# Patient Record
Sex: Male | Born: 1935 | Race: White | Hispanic: No | Marital: Single | Smoking: Never smoker
Health system: Southern US, Community
[De-identification: ages and names within clinical notes are randomized; demographics above are authoritative.]

## PROBLEM LIST (undated history)

## (undated) DIAGNOSIS — Z950 Presence of cardiac pacemaker: Secondary | ICD-10-CM

## (undated) DIAGNOSIS — I2510 Atherosclerotic heart disease of native coronary artery without angina pectoris: Secondary | ICD-10-CM

## (undated) DIAGNOSIS — N400 Enlarged prostate without lower urinary tract symptoms: Secondary | ICD-10-CM

## (undated) DIAGNOSIS — K219 Gastro-esophageal reflux disease without esophagitis: Secondary | ICD-10-CM

## (undated) HISTORY — PX: CORONARY ARTERY BYPASS GRAFT: SHX141

## (undated) HISTORY — PX: PACEMAKER INSERTION: SHX728

## (undated) HISTORY — PX: REPLACEMENT TOTAL KNEE: SUR1224

---

## 1998-11-01 ENCOUNTER — Ambulatory Visit (HOSPITAL_COMMUNITY): Admission: RE | Admit: 1998-11-01 | Discharge: 1998-11-01 | Payer: Self-pay | Admitting: Cardiology

## 2007-11-22 ENCOUNTER — Emergency Department (HOSPITAL_BASED_OUTPATIENT_CLINIC_OR_DEPARTMENT_OTHER): Admission: EM | Admit: 2007-11-22 | Discharge: 2007-11-22 | Payer: Self-pay | Admitting: Emergency Medicine

## 2014-10-16 ENCOUNTER — Emergency Department (HOSPITAL_BASED_OUTPATIENT_CLINIC_OR_DEPARTMENT_OTHER): Payer: Medicare PPO

## 2014-10-16 ENCOUNTER — Encounter (HOSPITAL_BASED_OUTPATIENT_CLINIC_OR_DEPARTMENT_OTHER): Payer: Self-pay

## 2014-10-16 ENCOUNTER — Emergency Department (HOSPITAL_BASED_OUTPATIENT_CLINIC_OR_DEPARTMENT_OTHER)
Admission: EM | Admit: 2014-10-16 | Discharge: 2014-10-16 | Disposition: A | Discharge status: Home / Self Care | Payer: Medicare PPO | Attending: Emergency Medicine | Admitting: Emergency Medicine

## 2014-10-16 DIAGNOSIS — H578 Other specified disorders of eye and adnexa: Secondary | ICD-10-CM | POA: Diagnosis not present

## 2014-10-16 DIAGNOSIS — I2510 Atherosclerotic heart disease of native coronary artery without angina pectoris: Secondary | ICD-10-CM | POA: Diagnosis not present

## 2014-10-16 DIAGNOSIS — J3489 Other specified disorders of nose and nasal sinuses: Secondary | ICD-10-CM | POA: Diagnosis not present

## 2014-10-16 DIAGNOSIS — M542 Cervicalgia: Secondary | ICD-10-CM | POA: Insufficient documentation

## 2014-10-16 DIAGNOSIS — R350 Frequency of micturition: Secondary | ICD-10-CM | POA: Diagnosis present

## 2014-10-16 DIAGNOSIS — Z950 Presence of cardiac pacemaker: Secondary | ICD-10-CM | POA: Diagnosis not present

## 2014-10-16 DIAGNOSIS — N390 Urinary tract infection, site not specified: Secondary | ICD-10-CM | POA: Insufficient documentation

## 2014-10-16 DIAGNOSIS — R05 Cough: Secondary | ICD-10-CM | POA: Insufficient documentation

## 2014-10-16 DIAGNOSIS — Z7982 Long term (current) use of aspirin: Secondary | ICD-10-CM | POA: Diagnosis not present

## 2014-10-16 DIAGNOSIS — K219 Gastro-esophageal reflux disease without esophagitis: Secondary | ICD-10-CM | POA: Insufficient documentation

## 2014-10-16 DIAGNOSIS — Z79899 Other long term (current) drug therapy: Secondary | ICD-10-CM | POA: Insufficient documentation

## 2014-10-16 DIAGNOSIS — Z87438 Personal history of other diseases of male genital organs: Secondary | ICD-10-CM | POA: Diagnosis not present

## 2014-10-16 HISTORY — DX: Gastro-esophageal reflux disease without esophagitis: K21.9

## 2014-10-16 HISTORY — DX: Presence of cardiac pacemaker: Z95.0

## 2014-10-16 HISTORY — DX: Benign prostatic hyperplasia without lower urinary tract symptoms: N40.0

## 2014-10-16 HISTORY — DX: Atherosclerotic heart disease of native coronary artery without angina pectoris: I25.10

## 2014-10-16 LAB — CBC WITH DIFFERENTIAL/PLATELET
BASOS ABS: 0 10*3/uL (ref 0.0–0.1)
BASOS PCT: 0 % (ref 0–1)
EOS ABS: 0 10*3/uL (ref 0.0–0.7)
EOS PCT: 0 % (ref 0–5)
HCT: 33.5 % — ABNORMAL LOW (ref 39.0–52.0)
HEMOGLOBIN: 10.8 g/dL — AB (ref 13.0–17.0)
Lymphocytes Relative: 10 % — ABNORMAL LOW (ref 12–46)
Lymphs Abs: 1.5 10*3/uL (ref 0.7–4.0)
MCH: 28.3 pg (ref 26.0–34.0)
MCHC: 32.2 g/dL (ref 30.0–36.0)
MCV: 87.7 fL (ref 78.0–100.0)
MONO ABS: 1.8 10*3/uL — AB (ref 0.1–1.0)
MONOS PCT: 12 % (ref 3–12)
NEUTROS ABS: 11.5 10*3/uL — AB (ref 1.7–7.7)
NEUTROS PCT: 78 % — AB (ref 43–77)
PLATELETS: 324 10*3/uL (ref 150–400)
RBC: 3.82 MIL/uL — ABNORMAL LOW (ref 4.22–5.81)
RDW: 14 % (ref 11.5–15.5)
WBC: 14.8 10*3/uL — AB (ref 4.0–10.5)

## 2014-10-16 LAB — URINALYSIS, ROUTINE W REFLEX MICROSCOPIC
BILIRUBIN URINE: NEGATIVE
Glucose, UA: NEGATIVE mg/dL
Hgb urine dipstick: NEGATIVE
Ketones, ur: NEGATIVE mg/dL
NITRITE: NEGATIVE
Protein, ur: NEGATIVE mg/dL
SPECIFIC GRAVITY, URINE: 1.017 (ref 1.005–1.030)
Urobilinogen, UA: 1 mg/dL (ref 0.0–1.0)
pH: 6 (ref 5.0–8.0)

## 2014-10-16 LAB — BASIC METABOLIC PANEL
ANION GAP: 9 (ref 5–15)
BUN: 21 mg/dL — ABNORMAL HIGH (ref 6–20)
CHLORIDE: 103 mmol/L (ref 101–111)
CO2: 25 mmol/L (ref 22–32)
Calcium: 8.8 mg/dL — ABNORMAL LOW (ref 8.9–10.3)
Creatinine, Ser: 1.33 mg/dL — ABNORMAL HIGH (ref 0.61–1.24)
GFR, EST AFRICAN AMERICAN: 55 mL/min — AB (ref >60)
GFR, EST NON AFRICAN AMERICAN: 47 mL/min — AB (ref >60)
Glucose, Bld: 119 mg/dL — ABNORMAL HIGH (ref 65–99)
Potassium: 3.7 mmol/L (ref 3.5–5.1)
Sodium: 137 mmol/L (ref 135–145)

## 2014-10-16 LAB — URINE MICROSCOPIC-ADD ON

## 2014-10-16 MED ORDER — CEPHALEXIN 500 MG PO CAPS: 500.0000 mg | ORAL_CAPSULE | Freq: Two times a day (BID) | ORAL | Status: AC

## 2014-10-16 MED ORDER — CEFTRIAXONE SODIUM 1 G IJ SOLR
1.0000 g | Freq: Once | INTRAMUSCULAR | Status: AC
  Administered 2014-10-16: 1 g via INTRAVENOUS

## 2014-10-16 MED ORDER — ACETAMINOPHEN 325 MG PO TABS
650.0000 mg | ORAL_TABLET | Freq: Once | ORAL | Status: AC
  Administered 2014-10-16: 650 mg via ORAL
  Filled 2014-10-16: qty 2

## 2014-10-16 MED ORDER — CEFTRIAXONE SODIUM 1 G IJ SOLR
INTRAMUSCULAR | Status: AC
  Filled 2014-10-16: qty 10

## 2014-10-16 NOTE — ED Provider Notes (Signed)
CSN: [id number]     Arrival date & time 10/16/14  1509 History  This chart was scribed for Blake Divine, MD by Placido Sou, ED scribe. This patient was seen in room MH10/MH10 and the patient's care was started at 3:35 PM.   Chief Complaint  Patient presents with  . Urinary Frequency   Patient is a 81 y.o. male presenting with frequency. The history is provided by the patient. No language interpreter was used.  Urinary Frequency This is a new problem. The current episode started more than 2 days ago. The problem occurs constantly. The problem has been gradually worsening. Pertinent negatives include no shortness of breath.    HPI Comments: Jeremy Potts is a 81 y.o. male, with a hx of benign prostatic hyperplasia, who presents to the Emergency Department complaining of increased urinary frequency with onset a few days ago and worsening symptoms beginning last night. Pt notes that beginning last night he urinated about 30x and is concerned that he has a kidney infection. Pt notes associated, diffuse, moderate, body aches that worsen with movement and further notes a mild cough, rhinorrhea and eye irritation. He notes taking metoprolol 1x per night for more than 6 months s/p have a pacemaker put in place in 11/2013. Pt also notes taking 325 mg aspirin and another blood pressure medication daily. He denies dysuria, SOB and chills.  Past Medical History  Diagnosis Date  . Pacemaker   . GERD (gastroesophageal reflux disease)   . BPH (benign prostatic hyperplasia)   . Coronary artery disease    No past surgical history on file. No family history on file. Social History  Substance Use Topics  . Smoking status: None  . Smokeless tobacco: None  . Alcohol Use: None    Review of Systems  Constitutional: Positive for fever. Negative for chills.  HENT: Positive for rhinorrhea.   Eyes: Positive for itching.  Respiratory: Positive for cough. Negative for shortness of breath.   Genitourinary:  Positive for frequency.  Musculoskeletal: Positive for myalgias, back pain, arthralgias and neck pain.  Skin: Negative for wound.  All other systems reviewed and are negative.  Allergies  Review of patient's allergies indicates not on file.  Home Medications   Prior to Admission medications   Medication Sig Start Date End Date Taking? Authorizing Provider  aspirin 325 MG tablet Take 325 mg by mouth daily.   Yes Historical Provider, MD  Cholecalciferol (VITAMIN D3) 2000 UNITS TABS Take by mouth.   Yes Historical Provider, MD  co-enzyme Q-10 30 MG capsule Take 100 mg by mouth daily.   Yes Historical Provider, MD  fenofibrate (TRICOR) 145 MG tablet Take 145 mg by mouth daily.   Yes Historical Provider, MD  lansoprazole (PREVACID) 30 MG capsule Take 30 mg by mouth daily.   Yes Historical Provider, MD  lisinopril (PRINIVIL,ZESTRIL) 10 MG tablet Take 10 mg by mouth daily.   Yes Historical Provider, MD  metoprolol tartrate (LOPRESSOR) 25 MG tablet Take 25 mg by mouth at bedtime.   Yes Historical Provider, MD  Misc Natural Products (OSTEO BI-FLEX ADV TRIPLE ST) TABS Take by mouth 2 (two) times daily.   Yes Historical Provider, MD  tamsulosin (FLOMAX) 0.4 MG CAPS capsule Take 0.4 mg by mouth.   Yes Historical Provider, MD  vitamin C (ASCORBIC ACID) 500 MG tablet Take 500 mg by mouth daily.   Yes Historical Provider, MD   BP 147/73 mmHg  Pulse 77  Temp(Src) 101.2 F (38.4 C) (Oral)  Resp  18  Ht  (1.753 m)  Wt 193 lb (87.544 kg)  BMI 28.49 kg/m2  SpO2 95% Physical Exam  Constitutional: He is oriented to person, place, and time. He appears well-developed and well-nourished. No distress.  HENT:  Head: Normocephalic and atraumatic.  Mouth/Throat: Oropharynx is clear and moist.  Eyes: Conjunctivae are normal. Pupils are equal, round, and reactive to light. No scleral icterus.  Neck: Neck supple.  Cardiovascular: Normal rate, regular rhythm, normal heart sounds and intact distal pulses.    No murmur heard. Pulmonary/Chest: Effort normal and breath sounds normal. No stridor. No respiratory distress. He has no wheezes. He has no rales.  Abdominal: Soft. He exhibits no distension. There is no tenderness. There is no rigidity, no rebound, no guarding and no CVA tenderness.  Musculoskeletal: Normal range of motion. He exhibits no edema.  Neurological: He is alert and oriented to person, place, and time.  Skin: Skin is warm and dry. No rash noted.  Psychiatric: He has a normal mood and affect. His behavior is normal.  Nursing note and vitals reviewed.   ED Course  Procedures  DIAGNOSTIC STUDIES: Oxygen Saturation is 95% on RA, adequate by my interpretation.    COORDINATION OF CARE: 3:46 PM Discussed treatment plan with pt at bedside and pt agreed to plan.  Labs Review Labs Reviewed  URINALYSIS, ROUTINE W REFLEX MICROSCOPIC (NOT AT [facility]) - Abnormal; Notable for the following:    APPearance CLOUDY (*)    Leukocytes, UA MODERATE (*)    All other components within normal limits  CBC WITH DIFFERENTIAL/PLATELET - Abnormal; Notable for the following:    WBC 14.8 (*)    RBC 3.82 (*)    Hemoglobin 10.8 (*)    HCT 33.5 (*)    Neutrophils Relative % 78 (*)    Neutro Abs 11.5 (*)    Lymphocytes Relative 10 (*)    Monocytes Absolute 1.8 (*)    All other components within normal limits  BASIC METABOLIC PANEL - Abnormal; Notable for the following:    Glucose, Bld 119 (*)    BUN 21 (*)    Creatinine, Ser 1.33 (*)    Calcium 8.8 (*)    GFR calc non Af Amer 47 (*)    GFR calc Af Amer 55 (*)    All other components within normal limits  URINE MICROSCOPIC-ADD ON - Abnormal; Notable for the following:    Bacteria, UA MANY (*)    All other components within normal limits  URINE CULTURE    Imaging Review Dg Chest 2 View  10/16/2014   CLINICAL DATA:  81 year old male with fever and body aches for the past 2 days.  EXAM: CHEST  2 VIEW  COMPARISON:  No priors.  FINDINGS: Lung  volumes are normal. No consolidative airspace disease. No pleural effusions. No pneumothorax. No pulmonary nodule or mass noted. Pulmonary vasculature and the cardiomediastinal silhouette are within normal limits. Atherosclerosis in the thoracic aorta. Status post median sternotomy for CABG. Left-sided pacemaker device in position with lead tips projecting over the expected location of the right atrium and right ventricular apex.  IMPRESSION: 1. No radiographic evidence of acute cardiopulmonary disease. 2. Atherosclerosis.   Electronically Signed   By: Trudie Reed M.D.   On: 10/16/2014 16:33   I, Blake Divine, MD, personally reviewed and evaluated these images and lab results as part of my medical decision-making.   EKG Interpretation None      MDM   Final diagnoses:  Urinary tract infection without hematuria, site unspecified    81 year old male presenting with urinary frequency. He also complains of diffuse body aches. Was found to be febrile on arrival. However, he is not septic. In fact, he is very well-appearing. He has had a mild cough in addition to his urinary symptoms, so will obtain chest x-ray. Screening labs due to age and fever..  Presentation consistent with acute UTI. During his ED course he has remained well-appearing and hemodynamically stable. He received a dose of Rocephin IV. Given as well as parents and stability, I think he is appropriate for outpatient treatment. He is in agreement with this plan. He knows to come back if symptoms worsen. He will follow-up with his primary doctor early next week.  I personally performed the services described in this documentation, which was scribed in my presence. The recorded information has been reviewed and is accurate.     Blake Divine, MD 10/16/14 2010

## 2014-10-16 NOTE — ED Notes (Signed)
Pt reports increased frequency and urgency x 1 day- back pain also

## 2014-10-16 NOTE — Discharge Instructions (Signed)

## 2014-10-19 LAB — URINE CULTURE: Culture: >=100000

## 2014-10-20 ENCOUNTER — Telehealth (HOSPITAL_COMMUNITY): Payer: Self-pay

## 2014-10-20 NOTE — Telephone Encounter (Signed)
Post ED Visit - Positive Culture Follow-up  Culture report reviewed by antimicrobial stewardship pharmacist:  Wes Dulaney, Pharm.D., BCPS  Celedonio Miyamoto, Pharm.D., BCPS  Georgina Pillion, Pharm.D., BCPS  [place], [address].D., BCPS, AAHIVP  Estella Husk, Pharm.D., BCPS, AAHIVP  [place], [address].D., BCPS X  T. [employer]. D.   Positive Urine culture, >/+ 100,000 colonies -> E Coli Treated with Cephalexin, organism sensitive to the same and no further patient follow-up is required at this time.  Arvid Right 10/20/2014, 4:59 PM

## 2015-04-08 ENCOUNTER — Encounter (HOSPITAL_BASED_OUTPATIENT_CLINIC_OR_DEPARTMENT_OTHER): Payer: Self-pay | Admitting: *Deleted

## 2015-04-08 ENCOUNTER — Emergency Department (HOSPITAL_BASED_OUTPATIENT_CLINIC_OR_DEPARTMENT_OTHER)
Admission: EM | Admit: 2015-04-08 | Discharge: 2015-04-08 | Disposition: A | Discharge status: Home / Self Care | Payer: Medicare PPO | Attending: Emergency Medicine | Admitting: Emergency Medicine

## 2015-04-08 ENCOUNTER — Emergency Department (HOSPITAL_BASED_OUTPATIENT_CLINIC_OR_DEPARTMENT_OTHER): Payer: Medicare PPO

## 2015-04-08 DIAGNOSIS — Z950 Presence of cardiac pacemaker: Secondary | ICD-10-CM | POA: Diagnosis not present

## 2015-04-08 DIAGNOSIS — Z951 Presence of aortocoronary bypass graft: Secondary | ICD-10-CM | POA: Insufficient documentation

## 2015-04-08 DIAGNOSIS — K219 Gastro-esophageal reflux disease without esophagitis: Secondary | ICD-10-CM | POA: Diagnosis not present

## 2015-04-08 DIAGNOSIS — Y9289 Other specified places as the place of occurrence of the external cause: Secondary | ICD-10-CM | POA: Insufficient documentation

## 2015-04-08 DIAGNOSIS — N400 Enlarged prostate without lower urinary tract symptoms: Secondary | ICD-10-CM | POA: Diagnosis not present

## 2015-04-08 DIAGNOSIS — I2510 Atherosclerotic heart disease of native coronary artery without angina pectoris: Secondary | ICD-10-CM | POA: Diagnosis not present

## 2015-04-08 DIAGNOSIS — S40012A Contusion of left shoulder, initial encounter: Secondary | ICD-10-CM | POA: Insufficient documentation

## 2015-04-08 DIAGNOSIS — Z7982 Long term (current) use of aspirin: Secondary | ICD-10-CM | POA: Insufficient documentation

## 2015-04-08 DIAGNOSIS — S4992XA Unspecified injury of left shoulder and upper arm, initial encounter: Secondary | ICD-10-CM | POA: Diagnosis present

## 2015-04-08 DIAGNOSIS — Y998 Other external cause status: Secondary | ICD-10-CM | POA: Insufficient documentation

## 2015-04-08 DIAGNOSIS — W1849XA Other slipping, tripping and stumbling without falling, initial encounter: Secondary | ICD-10-CM | POA: Diagnosis not present

## 2015-04-08 DIAGNOSIS — W228XXA Striking against or struck by other objects, initial encounter: Secondary | ICD-10-CM | POA: Insufficient documentation

## 2015-04-08 DIAGNOSIS — Z79899 Other long term (current) drug therapy: Secondary | ICD-10-CM | POA: Insufficient documentation

## 2015-04-08 DIAGNOSIS — Z792 Long term (current) use of antibiotics: Secondary | ICD-10-CM | POA: Insufficient documentation

## 2015-04-08 DIAGNOSIS — Y9389 Activity, other specified: Secondary | ICD-10-CM | POA: Diagnosis not present

## 2015-04-08 NOTE — ED Provider Notes (Signed)
CSN: [id number]     Arrival date & time 04/08/15  2124 History  By signing my name below, I, Phillis Haggis, attest that this documentation has been prepared under the direction and in the presence of Benjiman Core, MD. Electronically Signed: Phillis Haggis, ED Scribe. 04/08/2015. 10:05 PM.   Chief Complaint  Patient presents with  . Shoulder Injury   The history is provided by the patient. No language interpreter was used.  HPI Comments: Jeremy Potts is a 82 y.o. Male with a hx of BPH, CAD, and a pacemaker in place who presents to the Emergency Department complaining of a left shoulder injury onset 2 hours ago.He states that he was attempting to get out of a chair when he slipped and hit his shoulder on the floor. He denies hitting head, neck pain, numbness, weakness, or LOC. Pt states that he is on blood thinners.  Past Medical History  Diagnosis Date  . Pacemaker   . GERD (gastroesophageal reflux disease)   . BPH (benign prostatic hyperplasia)   . Coronary artery disease    Past Surgical History  Procedure Laterality Date  . Coronary artery bypass graft    . Pacemaker insertion    . Replacement total knee Bilateral    History reviewed. No pertinent family history. Social History  Substance Use Topics  . Smoking status: Never Smoker   . Smokeless tobacco: None  . Alcohol Use: No    Review of Systems  Musculoskeletal: Positive for arthralgias. Negative for neck pain.  Neurological: Negative for syncope, weakness and numbness.  All other systems reviewed and are negative.  Allergies  Review of patient's allergies indicates no known allergies.  Home Medications   Prior to Admission medications   Medication Sig Start Date End Date Taking? Authorizing Provider  aspirin 325 MG tablet Take 325 mg by mouth daily.    Historical Provider, MD  cephALEXin (KEFLEX) 500 MG capsule Take 1 capsule (500 mg total) by mouth 2 (two) times daily. 10/16/14   Blake Divine, MD   Cholecalciferol (VITAMIN D3) 2000 UNITS TABS Take by mouth.    Historical Provider, MD  co-enzyme Q-10 30 MG capsule Take 100 mg by mouth daily.    Historical Provider, MD  fenofibrate (TRICOR) 145 MG tablet Take 145 mg by mouth daily.    Historical Provider, MD  lansoprazole (PREVACID) 30 MG capsule Take 30 mg by mouth daily.    Historical Provider, MD  lisinopril (PRINIVIL,ZESTRIL) 10 MG tablet Take 10 mg by mouth daily.    Historical Provider, MD  metoprolol tartrate (LOPRESSOR) 25 MG tablet Take 25 mg by mouth at bedtime.    Historical Provider, MD  Misc Natural Products (OSTEO BI-FLEX ADV TRIPLE ST) TABS Take by mouth 2 (two) times daily.    Historical Provider, MD  tamsulosin (FLOMAX) 0.4 MG CAPS capsule Take 0.4 mg by mouth.    Historical Provider, MD  vitamin C (ASCORBIC ACID) 500 MG tablet Take 500 mg by mouth daily.    Historical Provider, MD   BP 133/84 mmHg  Pulse 80  Temp(Src) 97.8 F (36.6 C) (Oral)  Resp 18  Ht  (1.803 m)  Wt 190 lb (86.183 kg)  BMI 26.51 kg/m2  SpO2 98% Physical Exam  Constitutional: He is oriented to person, place, and time. He appears well-developed and well-nourished.  HENT:  Head: Normocephalic and atraumatic.  Neck: Normal range of motion.  Cardiovascular: Normal rate and regular rhythm.  Exam reveals friction rub.   Pulmonary/Chest: Effort  normal and breath sounds normal.  Abdominal: Soft. There is no tenderness.  Musculoskeletal:  Tenderness to anterior of left shoulder. Decreased ROM due to pain. No deformity.   Neurological: He is alert and oriented to person, place, and time.  Skin: Skin is warm and dry.  Nursing note and vitals reviewed.   ED Course  Procedures (including critical care time)Dg Shoulder Left  04/08/2015  CLINICAL DATA:  Patient with left shoulder injury 2 hours prior. Status post slipping and hitting shoulder on door frame. Initial encounter. EXAM: LEFT SHOULDER - 2+ VIEW COMPARISON:  None. FINDINGS: Glenohumeral  joint space narrowing with associated subchondral sclerosis of the humeral head. Normal anatomic alignment. No evidence for acute fracture or dislocation. Visualized left hemi thorax is unremarkable. IMPRESSION: No acute osseous abnormality. Shoulder joint degenerative changes. Electronically Signed   By: Annia Belt M.D.   On: 04/08/2015 22:01    9:36 PM-Discussed treatment plan which includes x-ray with pt at bedside and pt agreed to plan.   Labs Review Labs Reviewed - No data to display  Imaging Review Dg Shoulder Left  04/08/2015  CLINICAL DATA:  Patient with left shoulder injury 2 hours prior. Status post slipping and hitting shoulder on door frame. Initial encounter. EXAM: LEFT SHOULDER - 2+ VIEW COMPARISON:  None. FINDINGS: Glenohumeral joint space narrowing with associated subchondral sclerosis of the humeral head. Normal anatomic alignment. No evidence for acute fracture or dislocation. Visualized left hemi thorax is unremarkable. IMPRESSION: No acute osseous abnormality. Shoulder joint degenerative changes. Electronically Signed   By: Annia Belt M.D.   On: 04/08/2015 22:01   I have personally reviewed and evaluated these images and lab results as part of my medical decision-making.   EKG Interpretation None      MDM   Final diagnoses:  Shoulder contusion, left, initial encounter    Patient with left shoulder contusion. Xray negative for fracture or dislocation. Will d/c. I personally performed the services described in this documentation, which was scribed in my presence. The recorded information has been reviewed and is accurate.      Benjiman Core, MD 04/08/15 2259

## 2015-04-08 NOTE — ED Notes (Signed)
Patient transported to X-ray 

## 2015-04-08 NOTE — ED Notes (Signed)
Pt refused arm sling due to increased pain with the device in place. Says it feels better when hanging more at his side.

## 2015-04-08 NOTE — ED Notes (Signed)
Pt c/o left shoulder injury x 2 hrs

## 2015-04-08 NOTE — Discharge Instructions (Signed)
Shoulder Sprain °A shoulder sprain is a partial or complete tear in one of the tough, fiber-like tissues (ligaments) in the shoulder. The ligaments in the shoulder help to hold the shoulder in place. °CAUSES °This condition may be caused by: °· A fall. °· A hit to the shoulder. °· A twist of the arm. °RISK FACTORS °This condition is more likely to develop in: °· People who play sports. °· People who have problems with balance or coordination. °SYMPTOMS °Symptoms of this condition include: °· Pain when moving the shoulder. °· Limited ability to move the shoulder. °· Swelling and tenderness on top of the shoulder. °· Warmth in the shoulder. °· A change in the shape of the shoulder. °· Redness or bruising on the shoulder. °DIAGNOSIS °This condition is diagnosed with a physical exam. During the exam, you may be asked to do simple exercises with your shoulder. You may also have imaging tests, such as X-rays, MRI, or a CT scan. These tests can show how severe the sprain is. °TREATMENT °This condition may be treated with: °· Rest. °· Pain medicine. °· Ice. °· A sling or brace. This is used to keep the arm still while the shoulder is healing. °· Physical therapy or rehabilitation exercises. These help to improve the range of motion and strength of the shoulder. °· Surgery (rare). Surgery may be needed if the sprain caused a joint to become unstable. Surgery may also be needed to reduce pain. °Some people may develop ongoing shoulder pain or lose some range of motion in the shoulder. However, most people do not develop long-term problems. °HOME CARE INSTRUCTIONS °· Rest. °· Take over-the-counter and prescription medicines only as told by your health care provider. °· If directed, apply ice to the area: °¨ Put ice in a plastic bag. °¨ Place a towel between your skin and the bag. °¨ Leave the ice on for 20 minutes, 2-3 times per day. °· If you were given a shoulder sling or brace: °¨ Wear it as told. °¨ Remove it to shower or  bathe. °¨ Move your arm only as much as told by your health care provider, but keep your hand moving to prevent swelling. °· If you were shown how to do any exercises, do them as told by your health care provider. °· Keep all follow-up visits as told by your health care provider. This is important. °SEEK MEDICAL CARE IF: °· Your pain gets worse. °· Your pain is not relieved with medicines. °· You have increased redness or swelling. °SEEK IMMEDIATE MEDICAL CARE IF: °· You have a fever. °· You cannot move your arm or shoulder. °· You develop numbness or tingling in your arms, hands, or fingers. °  °This information is not intended to replace advice given to you by your health care provider. Make sure you discuss any questions you have with your health care provider. °  °Document Released: 07/08/2008 Document Revised: 11/10/2014 Document Reviewed: 06/14/2014 °Elsevier Interactive Patient Education ©2016 [employer] ° °

## 2015-04-08 NOTE — ED Notes (Signed)
Pt verbalizes understanding of d/c instructions and denies any further needs at this time. 

## 2015-04-24 ENCOUNTER — Encounter (HOSPITAL_BASED_OUTPATIENT_CLINIC_OR_DEPARTMENT_OTHER): Payer: Self-pay | Admitting: *Deleted

## 2015-04-24 ENCOUNTER — Emergency Department (HOSPITAL_BASED_OUTPATIENT_CLINIC_OR_DEPARTMENT_OTHER)
Admission: EM | Admit: 2015-04-24 | Discharge: 2015-04-24 | Disposition: A | Discharge status: Home / Self Care | Payer: Medicare PPO | Attending: Emergency Medicine | Admitting: Emergency Medicine

## 2015-04-24 DIAGNOSIS — Z79899 Other long term (current) drug therapy: Secondary | ICD-10-CM | POA: Insufficient documentation

## 2015-04-24 DIAGNOSIS — H1132 Conjunctival hemorrhage, left eye: Secondary | ICD-10-CM | POA: Insufficient documentation

## 2015-04-24 DIAGNOSIS — H578 Other specified disorders of eye and adnexa: Secondary | ICD-10-CM | POA: Diagnosis present

## 2015-04-24 DIAGNOSIS — Z951 Presence of aortocoronary bypass graft: Secondary | ICD-10-CM | POA: Insufficient documentation

## 2015-04-24 DIAGNOSIS — N400 Enlarged prostate without lower urinary tract symptoms: Secondary | ICD-10-CM | POA: Diagnosis not present

## 2015-04-24 DIAGNOSIS — Z792 Long term (current) use of antibiotics: Secondary | ICD-10-CM | POA: Diagnosis not present

## 2015-04-24 DIAGNOSIS — Z7982 Long term (current) use of aspirin: Secondary | ICD-10-CM | POA: Diagnosis not present

## 2015-04-24 DIAGNOSIS — Z950 Presence of cardiac pacemaker: Secondary | ICD-10-CM | POA: Diagnosis not present

## 2015-04-24 DIAGNOSIS — K219 Gastro-esophageal reflux disease without esophagitis: Secondary | ICD-10-CM | POA: Insufficient documentation

## 2015-04-24 DIAGNOSIS — I2510 Atherosclerotic heart disease of native coronary artery without angina pectoris: Secondary | ICD-10-CM | POA: Diagnosis not present

## 2015-04-24 MED ORDER — FLUORESCEIN SODIUM 1 MG OP STRP
1.0000 | ORAL_STRIP | Freq: Once | OPHTHALMIC | Status: AC
  Administered 2015-04-24: 1 via OPHTHALMIC
  Filled 2015-04-24: qty 1

## 2015-04-24 MED ORDER — TETRACAINE HCL 0.5 % OP SOLN
1.0000 [drp] | Freq: Once | OPHTHALMIC | Status: AC
  Administered 2015-04-24: 1 [drp] via OPHTHALMIC
  Filled 2015-04-24: qty 4

## 2015-04-24 NOTE — ED Provider Notes (Signed)
CSN: [id number]     Arrival date & time 04/24/15  1421 History  By signing my name below, I, Linus Galas, attest that this documentation has been prepared under the direction and in the presence of No att. providers found. Electronically Signed: Linus Galas, ED Scribe. 04/24/2015. 4:35 PM.   Chief Complaint  Patient presents with  . Eye Injury   The history is provided by the patient. No language interpreter was used.   HPI Comments: Jeremy Potts is a 82 y.o. male with a PMHx GERD, BPH, CAD and a pacemaker who presents to the Emergency Department for an evaluation for a possible left eye injury that occurred this morning. Pt reports he rubbed his left eye this morning as if something was in his eye. Since then pt reports his "eyes have been red and suspects bleeding". Pt denies any eye pain, vision changes or any other symptoms at this time.  No headache.  No drainage from eyes.  He has not had any treatment prior to arrival.  There are no other associated systemic symptoms, there are no other alleviating or modifying factors.   Past Medical History  Diagnosis Date  . Pacemaker   . GERD (gastroesophageal reflux disease)   . BPH (benign prostatic hyperplasia)   . Coronary artery disease    Past Surgical History  Procedure Laterality Date  . Coronary artery bypass graft    . Pacemaker insertion    . Replacement total knee Bilateral    No family history on file. Social History  Substance Use Topics  . Smoking status: Never Smoker   . Smokeless tobacco: None  . Alcohol Use: No    Review of Systems  Constitutional: Negative for fever and chills.  Eyes: Positive for redness. Negative for pain and visual disturbance.  Respiratory: Negative for shortness of breath.   Neurological: Negative for weakness and numbness.  Psychiatric/Behavioral: Negative for confusion.  All other systems reviewed and are negative.  Allergies  Review of patient's allergies indicates no known  allergies.  Home Medications   Prior to Admission medications   Medication Sig Start Date End Date Taking? Authorizing Provider  aspirin 325 MG tablet Take 325 mg by mouth daily.    Historical Provider, MD  cephALEXin (KEFLEX) 500 MG capsule Take 1 capsule (500 mg total) by mouth 2 (two) times daily. 10/16/14   Blake Divine, MD  Cholecalciferol (VITAMIN D3) 2000 UNITS TABS Take by mouth.    Historical Provider, MD  co-enzyme Q-10 30 MG capsule Take 100 mg by mouth daily.    Historical Provider, MD  fenofibrate (TRICOR) 145 MG tablet Take 145 mg by mouth daily.    Historical Provider, MD  lansoprazole (PREVACID) 30 MG capsule Take 30 mg by mouth daily.    Historical Provider, MD  lisinopril (PRINIVIL,ZESTRIL) 10 MG tablet Take 10 mg by mouth daily.    Historical Provider, MD  metoprolol tartrate (LOPRESSOR) 25 MG tablet Take 25 mg by mouth at bedtime.    Historical Provider, MD  Misc Natural Products (OSTEO BI-FLEX ADV TRIPLE ST) TABS Take by mouth 2 (two) times daily.    Historical Provider, MD  tamsulosin (FLOMAX) 0.4 MG CAPS capsule Take 0.4 mg by mouth.    Historical Provider, MD  vitamin C (ASCORBIC ACID) 500 MG tablet Take 500 mg by mouth daily.    Historical Provider, MD   BP 130/67 mmHg  Pulse 71  Temp(Src) 99.2 F (37.3 C) (Oral)  Resp 18  Ht  (  1.803 m)  Wt 190 lb (86.183 kg)  BMI 26.51 kg/m2  SpO2 97%  Vitals reviewed Physical Exam  Physical Examination: General appearance - alert, well appearing, and in no distress Mental status - alert, oriented to person, place, and time Eyes - PERRL, EOMI, left lateral and inferior subconjunctival hemmorrhage, no corneal abrasion, no foreign body seen on flourescein stain Mouth - mucous membranes moist, pharynx normal without lesions Chest - clear to auscultation, no wheezes, rales or rhonchi, symmetric air entry Heart - normal rate, regular rhythm, normal S1, S2, no murmurs, rubs, clicks or gallops Neurological - alert, oriented,  normal speech Extremities - peripheral pulses normal, no pedal edema, no clubbing or cyanosis Skin - normal coloration and turgor, no rashes  ED Course  Procedures   DIAGNOSTIC STUDIES: Oxygen Saturation is 96% on room air, normal by my interpretation.    COORDINATION OF CARE: 4:09 PM Will order visual acuity screening. Discussed treatment plan with pt at bedside including following up with Opthalmology. Pt agreed to plan.  MDM   Final diagnoses:  Subconjunctival hemorrhage, left    Pt presenting with suconjunctival hemmorhage.  Visual acuity intact, EOM full. Fluorescein stain shows no corneal abrasion, negative seidel sign.  No foreign body idenitified, not under lids either.  Pt has opthalmologist and will call them tomorrow to be seen.  Pt has irritation from his cpap machine and is concerned about it damaging his eye.  Will give hard eyepatch that he can place there tonight when using his cpap machine.  Discharged with strict return precautions.  Pt agreeable with plan.  I personally performed the services described in this documentation, which was scribed in my presence. The recorded information has been reviewed and is accurate.     Jerelyn Scott, MD 04/24/15 [phone]

## 2015-04-24 NOTE — ED Notes (Signed)
Pt reports bleeding from left eye today. States rubbed eye this morning and has fb sensation- sclera red with bleeding noted

## 2015-04-24 NOTE — Discharge Instructions (Signed)
Return to the ED with any concerns including changes in vision, drainage from eye, or any other alarming symptoms

## 2016-06-22 IMAGING — DX DG SHOULDER 2+V*L*
3 series · 3 of 3 positions shown · non-contrast
Comparison: None.

CLINICAL DATA: Patient with left shoulder injury 2 hours prior.
Status post slipping and hitting shoulder on door frame. Initial
encounter.

EXAM:
LEFT SHOULDER - 2+ VIEW

[shoulder grashey]
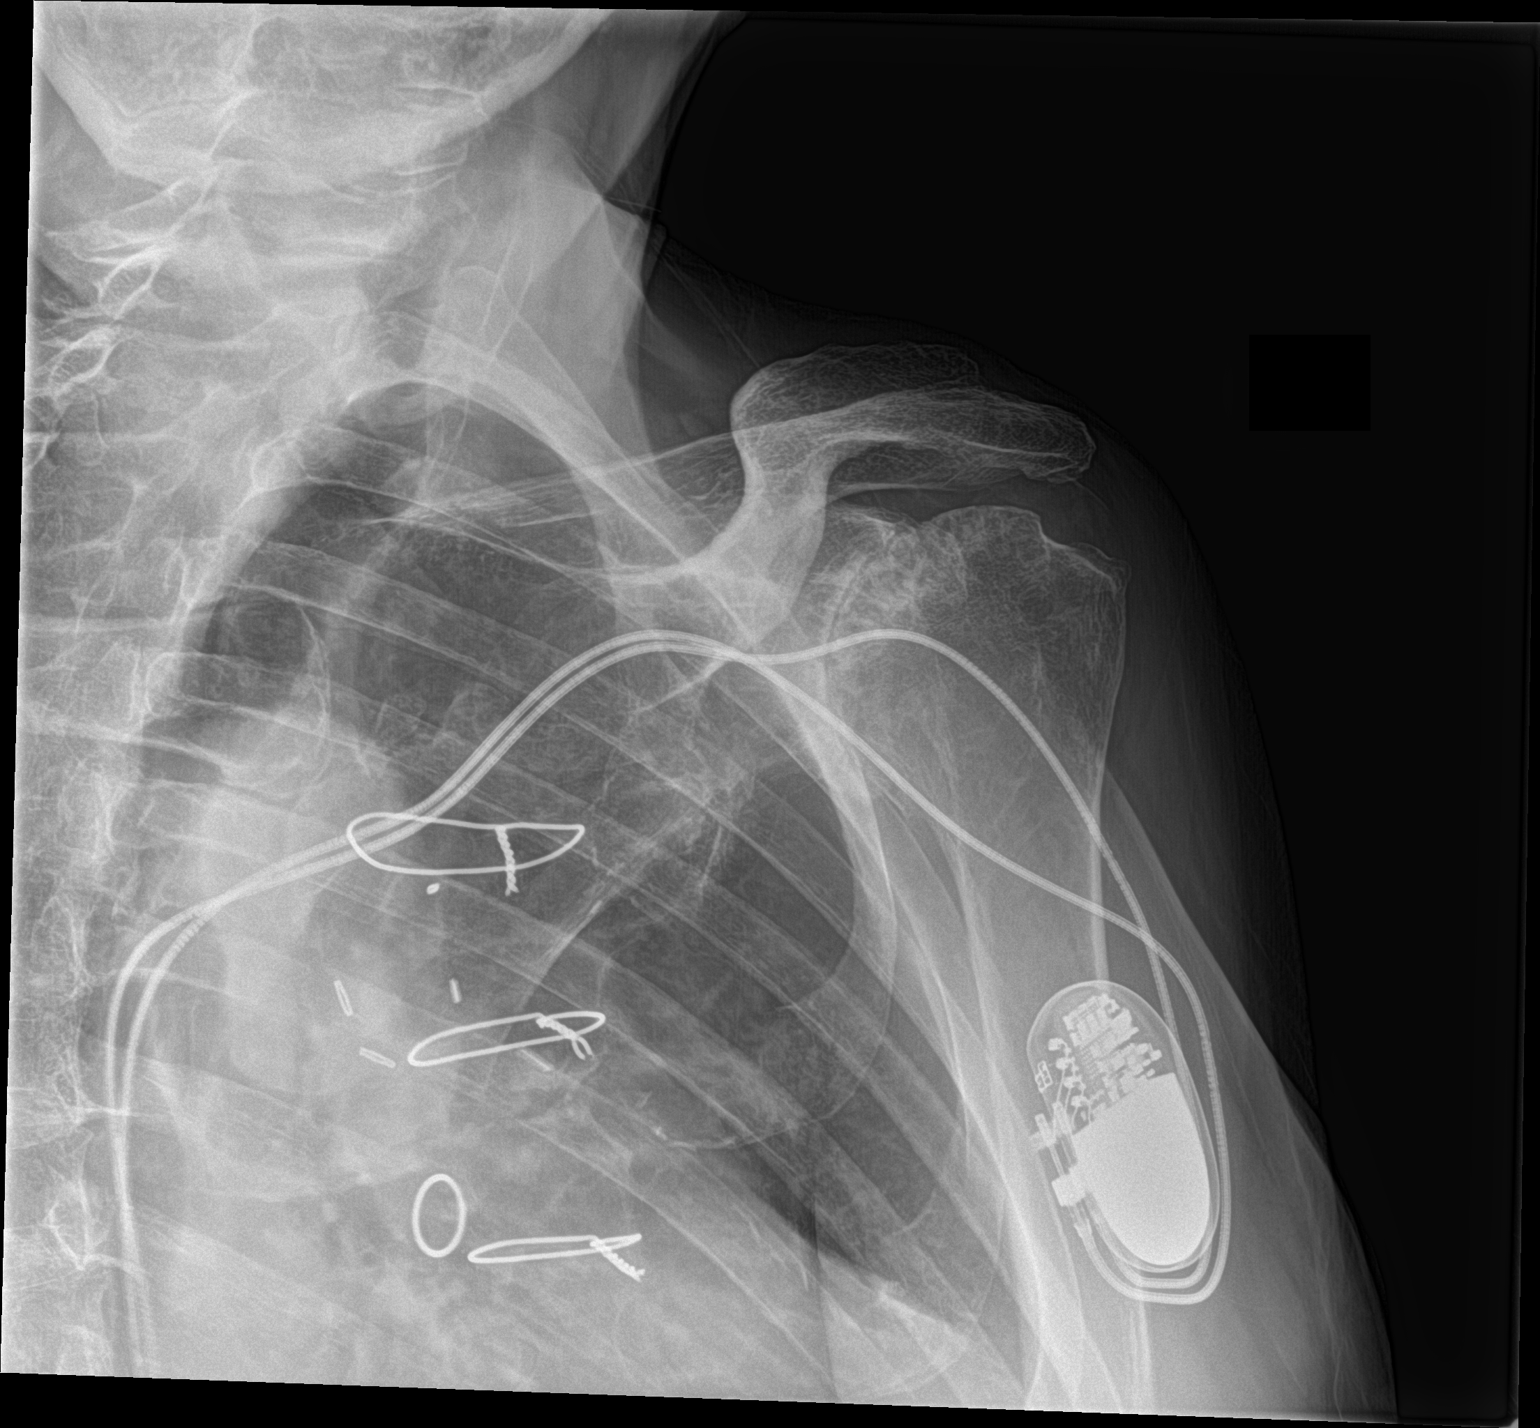

[shoulder y view]
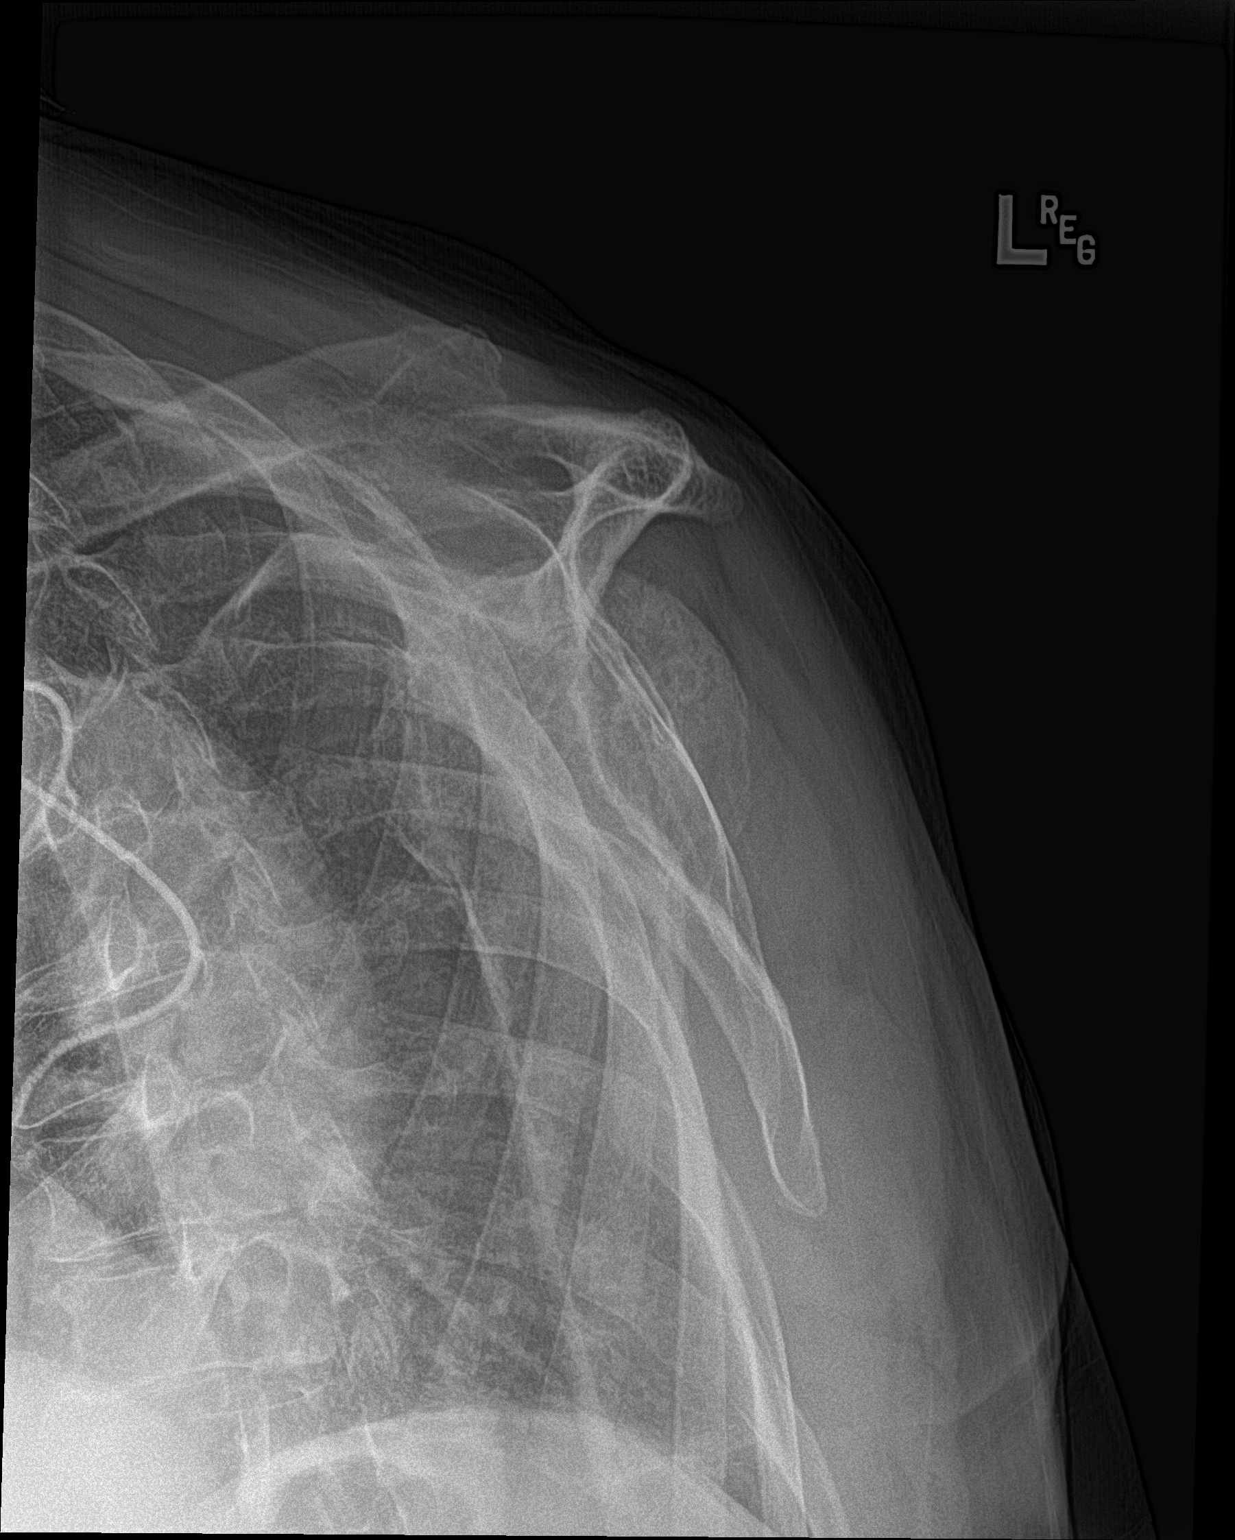

[shoulder axillary]
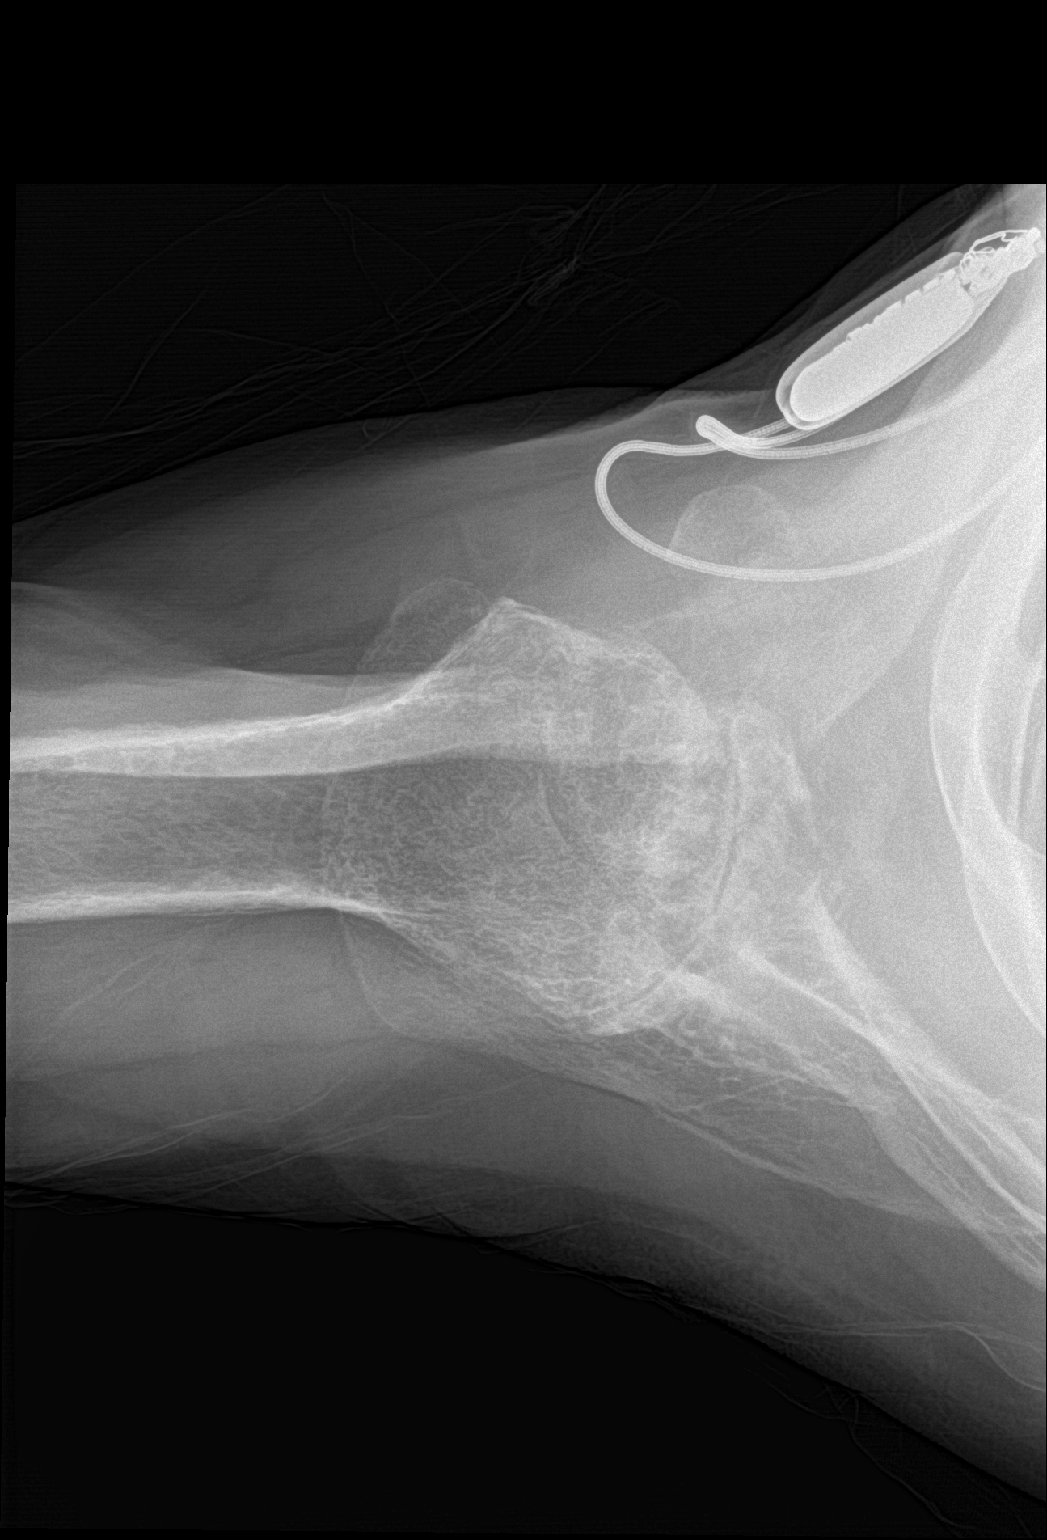

[3 of 3 positions shown; findings below may reference images not displayed]

FINDINGS: Glenohumeral joint space narrowing with associated subchondral
sclerosis of the humeral head. Normal anatomic alignment. No
evidence for acute fracture or dislocation. Visualized left hemi
thorax is unremarkable.
IMPRESSION: No acute osseous abnormality.

Shoulder joint degenerative changes.

## 2017-07-11 ENCOUNTER — Emergency Department (HOSPITAL_BASED_OUTPATIENT_CLINIC_OR_DEPARTMENT_OTHER)
Admission: EM | Admit: 2017-07-11 | Discharge: 2017-07-11 | Disposition: A | Discharge status: Home / Self Care | Payer: Medicare HMO | Attending: Emergency Medicine | Admitting: Emergency Medicine

## 2017-07-11 ENCOUNTER — Encounter (HOSPITAL_BASED_OUTPATIENT_CLINIC_OR_DEPARTMENT_OTHER): Payer: Self-pay | Admitting: Emergency Medicine

## 2017-07-11 ENCOUNTER — Other Ambulatory Visit: Payer: Self-pay

## 2017-07-11 DIAGNOSIS — Z7982 Long term (current) use of aspirin: Secondary | ICD-10-CM | POA: Diagnosis not present

## 2017-07-11 DIAGNOSIS — Z96653 Presence of artificial knee joint, bilateral: Secondary | ICD-10-CM | POA: Insufficient documentation

## 2017-07-11 DIAGNOSIS — I2510 Atherosclerotic heart disease of native coronary artery without angina pectoris: Secondary | ICD-10-CM | POA: Insufficient documentation

## 2017-07-11 DIAGNOSIS — K068 Other specified disorders of gingiva and edentulous alveolar ridge: Secondary | ICD-10-CM | POA: Diagnosis not present

## 2017-07-11 DIAGNOSIS — Z951 Presence of aortocoronary bypass graft: Secondary | ICD-10-CM | POA: Diagnosis not present

## 2017-07-11 DIAGNOSIS — Z79899 Other long term (current) drug therapy: Secondary | ICD-10-CM | POA: Diagnosis not present

## 2017-07-11 DIAGNOSIS — Z7901 Long term (current) use of anticoagulants: Secondary | ICD-10-CM | POA: Insufficient documentation

## 2017-07-11 DIAGNOSIS — K1379 Other lesions of oral mucosa: Secondary | ICD-10-CM

## 2017-07-11 DIAGNOSIS — Z950 Presence of cardiac pacemaker: Secondary | ICD-10-CM | POA: Insufficient documentation

## 2017-07-11 NOTE — ED Provider Notes (Signed)
[facility] EMERGENCY DEPARTMENT Provider Note   CSN: [id number] Arrival date & time: 07/11/17  1028     History   Chief Complaint Chief Complaint  Patient presents with  . Mouth bleedng    HPI Jeremy Potts is a 84 y.o. male.  Patient who is currently on Eliquis due to atrial fibrillation, presents after dental procedure where he had extraction of tooth 1 week ago, restarted Eliquis the night of the procedure without any problems until the past 24 hours when he had bleeding from the area.  It was noted that the stitches came out.  Patient saw his dentist this morning and the bleeding was stopped.  He was referred to the emergency department for vitamin K shot.  Patient currently has no complaints.     Past Medical History:  Diagnosis Date  . BPH (benign prostatic hyperplasia)   . Coronary artery disease   . GERD (gastroesophageal reflux disease)   . Pacemaker     There are no active problems to display for this patient.   Past Surgical History:  Procedure Laterality Date  . CORONARY ARTERY BYPASS GRAFT    . PACEMAKER INSERTION    . REPLACEMENT TOTAL KNEE Bilateral         Home Medications    Prior to Admission medications   Medication Sig Start Date End Date Taking? Authorizing Provider  apixaban (ELIQUIS) 5 MG TABS tablet Take 5 mg by mouth 2 (two) times daily.   Yes [provider]  finasteride (PROSCAR) 5 MG tablet Take 5 mg by mouth daily.   Yes [provider]  memantine (NAMENDA) 5 MG tablet Take 5 mg by mouth 2 (two) times daily.   Yes [provider]  pantoprazole (PROTONIX) 40 MG tablet Take 40 mg by mouth 2 (two) times daily.   Yes [provider]  solifenacin (VESICARE) 10 MG tablet Take by mouth daily.   Yes [provider]  aspirin 325 MG tablet Take 325 mg by mouth daily.    [provider]  cephALEXin (KEFLEX) 500 MG capsule Take 1 capsule (500 mg total) by mouth 2 (two) times daily.  10/16/14   Blake Divine, MD  Cholecalciferol (VITAMIN D3) 2000 UNITS TABS Take by mouth.    [provider]  co-enzyme Q-10 30 MG capsule Take 100 mg by mouth daily.    [provider]  fenofibrate (TRICOR) 145 MG tablet Take 145 mg by mouth daily.    [provider]  lisinopril (PRINIVIL,ZESTRIL) 10 MG tablet Take 10 mg by mouth daily.    [provider]  Misc Natural Products (OSTEO BI-FLEX ADV TRIPLE ST) TABS Take by mouth 2 (two) times daily.    [provider]  tamsulosin (FLOMAX) 0.4 MG CAPS capsule Take 0.4 mg by mouth.    [provider]  vitamin C (ASCORBIC ACID) 500 MG tablet Take 500 mg by mouth daily.    [provider]    Family History No family history on file.  Social History Social History   Tobacco Use  . Smoking status: Never Smoker  . Smokeless tobacco: Never Used  Substance Use Topics  . Alcohol use: No  . Drug use: Not on file     Allergies   Penicillins   Review of Systems Review of Systems  HENT: Positive for dental problem.      Physical Exam Updated Vital Signs BP (!) 149/84 (BP Location: Left Arm)   Pulse 69  Temp 98.2 F (36.8 C) (Oral)   Resp 18   Ht  (1.778 m)   Wt 77.1 kg (170 lb)   SpO2 97%   BMI 24.39 kg/m   Physical Exam  HENT:  Mouth/Throat: No oral lesions.  No active bleeding from the surgical site of the left jaw. Multiple previously extracted teeth, no other concerning findings.     ED Treatments / Results  Labs (all labs ordered are listed, but only abnormal results are displayed) Labs Reviewed - No data to display  EKG None  Radiology No results found.  Procedures Procedures (including critical care time)  Medications Ordered in ED Medications - No data to display   Initial Impression / Assessment and Plan / ED Course  I have reviewed the triage vital signs and the nursing notes.  Pertinent labs & imaging results that were available  during my care of the patient were reviewed by me and considered in my medical decision making (see chart for details).     Patient seen and examined.  Exam is reassuring.  Discussed that vitamin K will not affect the patient's current anticoagulation and that if he has bleeding resume, they need to consider temporarily stopping the Coumadin.  He should discuss this with his dentist and prescribing provider if bleeding resumes.  Vital signs reviewed and are as follows: BP (!) 149/84 (BP Location: Left Arm)   Pulse 69   Temp 98.2 F (36.8 C) (Oral)   Resp 18   Ht  (1.778 m)   Wt 77.1 kg (170 lb)   SpO2 97%   BMI 24.39 kg/m     Final Clinical Impressions(s) / ED Diagnoses   Final diagnoses:  Bleeding from mouth   Patient with intraoral bleeding, now stopped after seen by dentist.  He was sent today for vitamin K shot which will not be effective for his current anticoagulation regimen.  He does not need emergent reversal.  Patient counseled and agrees with plan.  They will follow-up as needed if symptoms resume.  ED Discharge Orders    None       Renne Crigler, Cordelia Poche 07/11/17 1122    Nira Conn, MD 07/11/17 1525

## 2017-07-11 NOTE — ED Notes (Signed)
ED Provider at bedside. 

## 2017-07-11 NOTE — Discharge Instructions (Signed)
Please read and follow all provided instructions.  Your diagnoses today include:  1. Bleeding from mouth    Tests performed today include:  Vital signs. See below for your results today.   Medications prescribed:   None  Home care instructions:  Follow any educational materials contained in this packet.  Follow-up instructions: Please follow-up with your dentist as needed for further evaluation of your symptoms.  You were not given a dose of vitamin K today because the blood thinner that you are taking does not react to vitamin K the way that blood thinners such as Coumadin do.  Return instructions:   Please return to the Emergency Department if you experience worsening symptoms.  Please call your dentist if bleeding starts again.  Please return if you have any other emergent concerns.  Additional Information:  Your vital signs today were: BP (!) 149/84 (BP Location: Left Arm)    Pulse 69    Temp 98.2 F (36.8 C) (Oral)    Resp 18    Ht  (1.778 m)    Wt 77.1 kg (170 lb)    SpO2 97%    BMI 24.39 kg/m  If your blood pressure (BP) was elevated above 135/85 this visit, please have this repeated by your doctor within one month. ---------------

## 2017-07-11 NOTE — ED Triage Notes (Signed)
Pt had tooth extraction 1 week ago. States the stitches came out during the night and he has had bleeding in his mouth. Pt started back on his eliquis 1 day after the procedure. Pt followed up with the dentist this morning and was told to come to the ED for a Vit K shot.

## 2020-08-03 DEATH — deceased
